# Patient Record
Sex: Female | Born: 1990 | Race: Black or African American | Hispanic: No | Marital: Single
Health system: Southern US, Community
[De-identification: ages and names within clinical notes are randomized; demographics above are authoritative.]

---

## 2019-07-01 ENCOUNTER — Ambulatory Visit: Payer: 59 | Attending: Internal Medicine

## 2019-07-01 DIAGNOSIS — Z23 Encounter for immunization: Secondary | ICD-10-CM

## 2019-07-01 NOTE — Progress Notes (Signed)
   Covid-19 Vaccination Clinic  Name:  SONNA LIPSKY    MRN: [id number] DOB: 1990-04-18  07/01/2019  Ms. Mishkin was observed post Covid-19 immunization for 15 minutes without incident. She was provided with Vaccine Information Sheet and instruction to access the V-Safe system.   Ms. Lapre was instructed to call 911 with any severe reactions post vaccine: Marland Kitchen Difficulty breathing  . Swelling of face and throat  . A fast heartbeat  . A bad rash all over body  . Dizziness and weakness   Immunizations Administered    Name Date Dose VIS Date Route   Pfizer COVID-19 Vaccine 07/01/2019  8:36 AM 0.3 mL 03/11/2019 Intramuscular   Manufacturer: [employer], [employer]   Lot: [id number]   NDC: [address]-6

## 2019-07-15 ENCOUNTER — Emergency Department: Payer: 59

## 2019-07-15 ENCOUNTER — Other Ambulatory Visit: Payer: Self-pay

## 2019-07-15 ENCOUNTER — Emergency Department
Admission: EM | Admit: 2019-07-15 | Discharge: 2019-07-15 | Disposition: A | Discharge status: Home / Self Care | Payer: 59 | Attending: Emergency Medicine | Admitting: Emergency Medicine

## 2019-07-15 ENCOUNTER — Encounter: Payer: Self-pay | Admitting: Emergency Medicine

## 2019-07-15 DIAGNOSIS — R0789 Other chest pain: Secondary | ICD-10-CM

## 2019-07-15 DIAGNOSIS — R079 Chest pain, unspecified: Secondary | ICD-10-CM | POA: Diagnosis not present

## 2019-07-15 DIAGNOSIS — R42 Dizziness and giddiness: Secondary | ICD-10-CM | POA: Diagnosis not present

## 2019-07-15 LAB — CBC
HCT: 39.1 % (ref 36.0–46.0)
Hemoglobin: 12.7 g/dL (ref 12.0–15.0)
MCH: 28.4 pg (ref 26.0–34.0)
MCHC: 32.5 g/dL (ref 30.0–36.0)
MCV: 87.5 fL (ref 80.0–100.0)
Platelets: 245 10*3/uL (ref 150–400)
RBC: 4.47 MIL/uL (ref 3.87–5.11)
RDW: 11.9 % (ref 11.5–15.5)
WBC: 4.9 10*3/uL (ref 4.0–10.5)
nRBC: 0 % (ref 0.0–0.2)

## 2019-07-15 LAB — BASIC METABOLIC PANEL
Anion gap: 10 (ref 5–15)
BUN: 11 mg/dL (ref 6–20)
CO2: 25 mmol/L (ref 22–32)
Calcium: 9.9 mg/dL (ref 8.9–10.3)
Chloride: 103 mmol/L (ref 98–111)
Creatinine, Ser: 0.72 mg/dL (ref 0.44–1.00)
GFR calc Af Amer: >60 mL/min (ref >60)
GFR calc non Af Amer: >60 mL/min (ref >60)
Glucose, Bld: 82 mg/dL (ref 70–99)
Potassium: 3.8 mmol/L (ref 3.5–5.1)
Sodium: 138 mmol/L (ref 135–145)

## 2019-07-15 LAB — TROPONIN I (HIGH SENSITIVITY): Troponin I (High Sensitivity): <2 ng/L (ref <18)

## 2019-07-15 MED ORDER — NAPROXEN 500 MG PO TABS: 500.0000 mg | ORAL_TABLET | Freq: Two times a day (BID) | ORAL | 2 refills | Status: AC

## 2019-07-15 NOTE — ED Provider Notes (Signed)
[facility] Emergency Department Provider Note   ____________________________________________    I have reviewed the triage vital signs and the nursing notes.   HISTORY  Chief Complaint Chest Pain and Dizziness     HPI Nichole Espinoza is a 29 y.o. female who presents with complaints of left anterior chest pain.  Patient reports she has had intermittent "twinges "in her left anterior chest close to her axilla for over a week now.  She denies shortness of breath.  No pleurisy.  Currently feels well and has no complaints.  No fevers chills or cough.  No history of heart disease.  No recent viral illnesses.  No nausea or vomiting or diaphoresis.  No injury to the area.  No calf pain or swelling  History reviewed. No pertinent past medical history.  There are no problems to display for this patient.   History reviewed. No pertinent surgical history.  Prior to Admission medications   Medication Sig Start Date End Date Taking? Authorizing Provider  naproxen (NAPROSYN) 500 MG tablet Take 1 tablet (500 mg total) by mouth 2 (two) times daily with a meal. 07/15/19   Jene Every, MD     Allergies Patient has no allergy information on record.  No family history on file.  Social History Social History   Tobacco Use  . Smoking status: Not on file  Substance Use Topics  . Alcohol use: Not on file  . Drug use: Not on file    Review of Systems  Constitutional: No fever/chills Eyes: No visual changes.  ENT: No sore throat. Cardiovascular: As above Respiratory: Denies shortness of breath. Gastrointestinal: No abdominal pain.   Genitourinary: Negative for dysuria. Musculoskeletal: Negative for back pain. Skin: Negative for rash. Neurological: Negative for headaches   ____________________________________________   PHYSICAL EXAM:  VITAL SIGNS: ED Triage Vitals  Enc Vitals Group     BP 07/15/19 1058 116/79     Pulse Rate 07/15/19 1058 84    Resp 07/15/19 1058 20     Temp 07/15/19 1100 99 F (37.2 C)     Temp Source 07/15/19 1100 Oral     SpO2 07/15/19 1058 99 %     Weight 07/15/19 1058 48.5 kg (107 lb)     Height 07/15/19 1058 1.651 m (5\' 5" )     Head Circumference --      Peak Flow --      Pain Score 07/15/19 1058 10     Pain Loc --      Pain Edu? --      Excl. in GC? --     Constitutional: Alert and oriented.   Nose: No congestion/rhinnorhea. Mouth/Throat: Mucous membranes are moist.   Neck:  Painless ROM Cardiovascular: Normal rate, regular rhythm. Grossly normal heart sounds.  Good peripheral circulation.  Palpated chest wall, no abnormalities bruising or rash Respiratory: Normal respiratory effort.  No retractions. Lungs CTAB. Gastrointestinal: Soft and nontender. No distention.  No CVA tenderness.  Musculoskeletal: No lower extremity tenderness nor edema.  Warm and well perfused Neurologic:  Normal speech and language. No gross focal neurologic deficits are appreciated.  Skin:  Skin is warm, dry and intact. No rash noted. Psychiatric: Mood and affect are normal. Speech and behavior are normal.  ____________________________________________   LABS (all labs ordered are listed, but only abnormal results are displayed)  Labs Reviewed  BASIC METABOLIC PANEL  CBC  POC URINE PREG, ED  TROPONIN I (HIGH SENSITIVITY)   ____________________________________________  EKG  ED ECG REPORT I, Lavonia Drafts, the attending physician, personally viewed and interpreted this ECG.  Date: 07/15/2019  Rhythm: normal sinus rhythm QRS Axis: normal Intervals: normal ST/T Wave abnormalities: normal Narrative Interpretation: no evidence of acute ischemia  ____________________________________________  RADIOLOGY  Chest x-ray unremarkable ____________________________________________   PROCEDURES  Procedure(s) performed: No  Procedures   Critical Care performed:  No ____________________________________________   INITIAL IMPRESSION / ASSESSMENT AND PLAN / ED COURSE  Pertinent labs & imaging results that were available during my care of the patient were reviewed by me and considered in my medical decision making (see chart for details).  Patient very well-appearing in no acute distress, description is suspicious for musculoskeletal chest pain.  Differential also includes myocarditis, pericarditis, very low risk for ACS.  Low risk for PE.  PERC negative.  EKG is quite reassuring.  Troponin is undetectable.  Lab work is benign.  Chest x-ray rules out pneumonia or pneumothorax.  Patient is asymptomatic here.  Recommend discharge with NSAIDs, outpatient follow-up with cardiology if any return of symptoms.  Return precautions discussed    ____________________________________________   FINAL CLINICAL IMPRESSION(S) / ED DIAGNOSES  Final diagnoses:  Atypical chest pain        Note:  This document was prepared using Dragon voice recognition software and may include unintentional dictation errors.   Lavonia Drafts, MD 07/15/19 [phone]

## 2019-07-15 NOTE — ED Triage Notes (Signed)
Pt reports was at work and all the sudden she got real hot and had to get up and walk around. Pt reports then she started with a sharp pain in her chest and some tingling in her left arm.

## 2019-07-25 ENCOUNTER — Ambulatory Visit: Payer: 59

## 2021-12-10 IMAGING — CR DG CHEST 2V
1 series · 2 of 2 positions shown · non-contrast
Comparison: None.

CLINICAL DATA: Chest pain

EXAM:
CHEST - 2 VIEW

[Series 1: dg chest 2 view · 0.14mm/px · 2 of 2 slices shown]
[im 1/2]
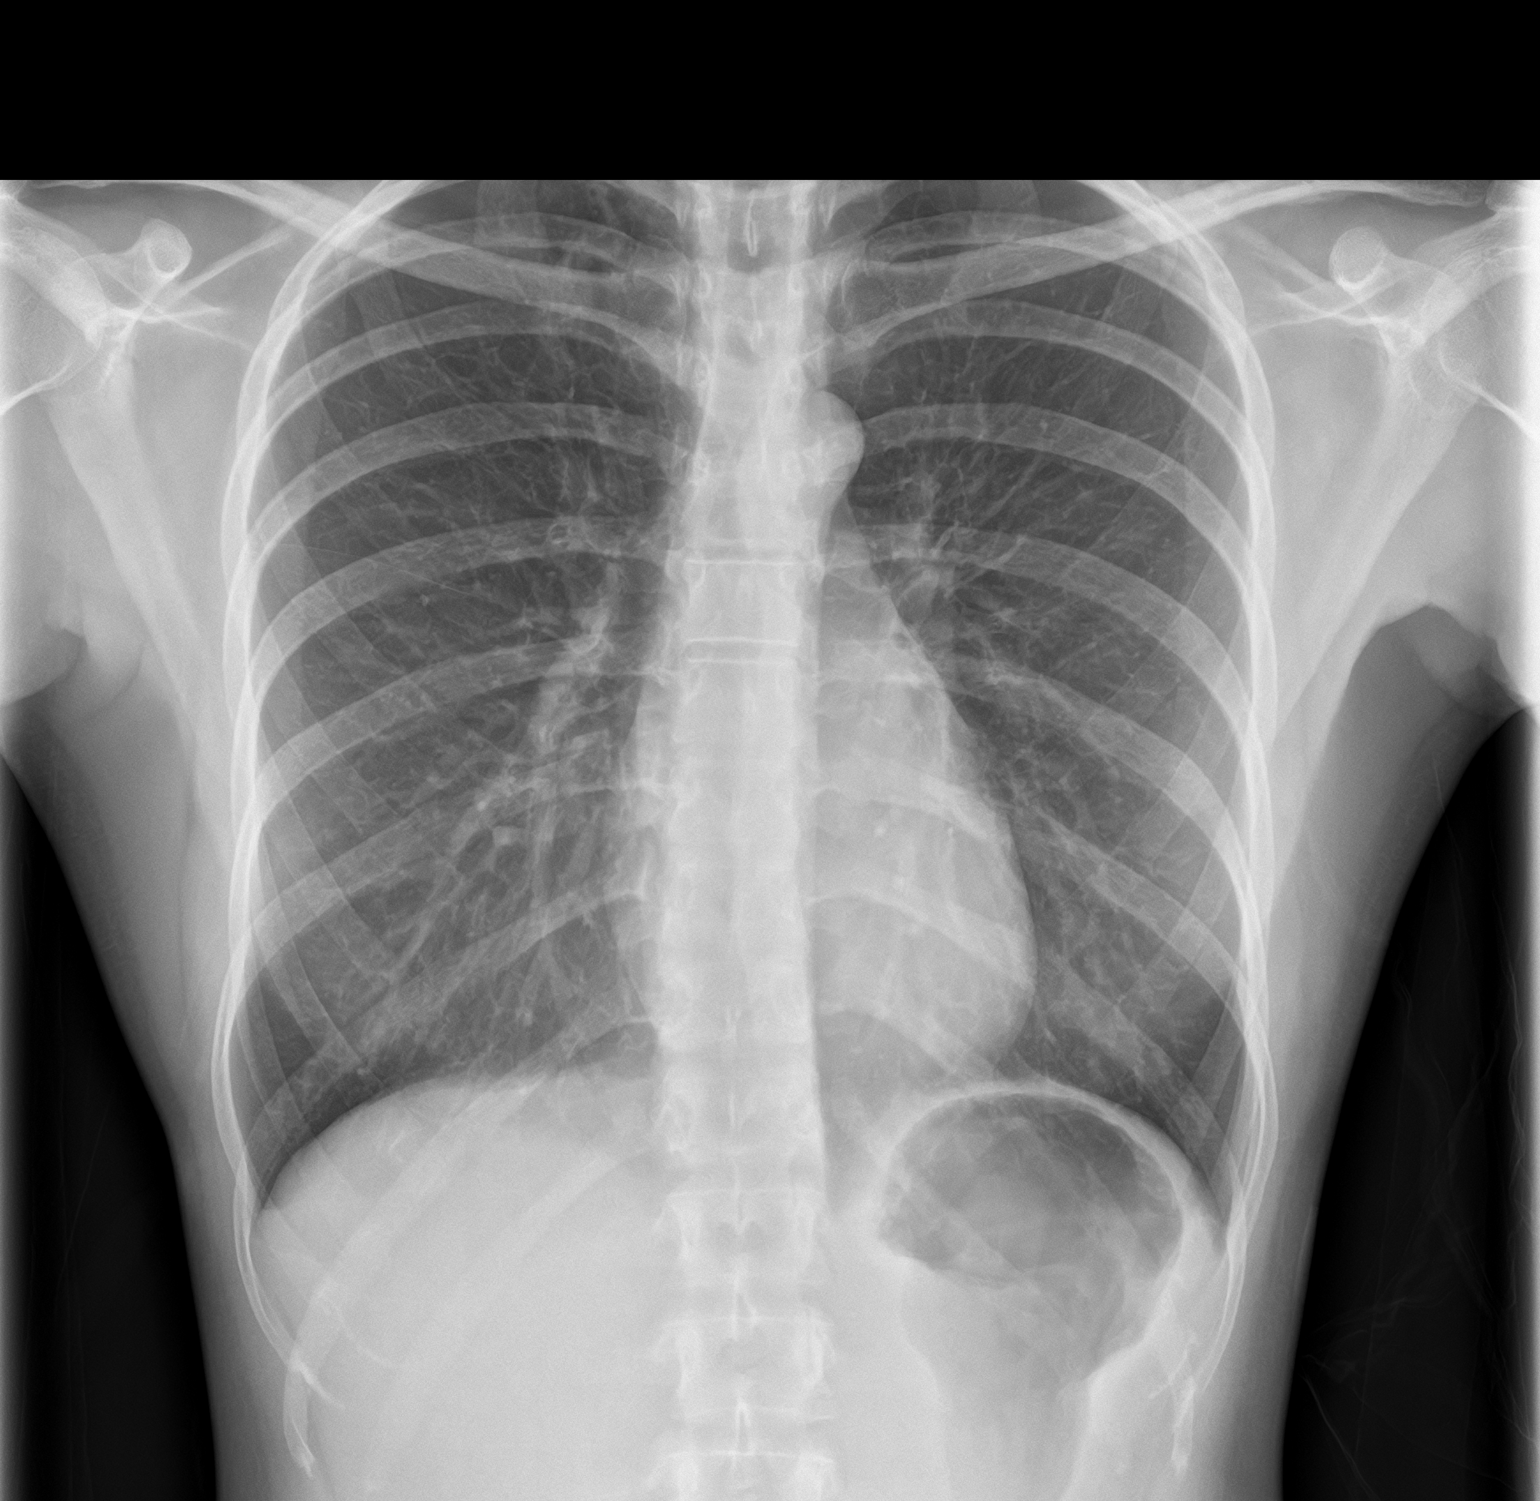
[im 2/2]
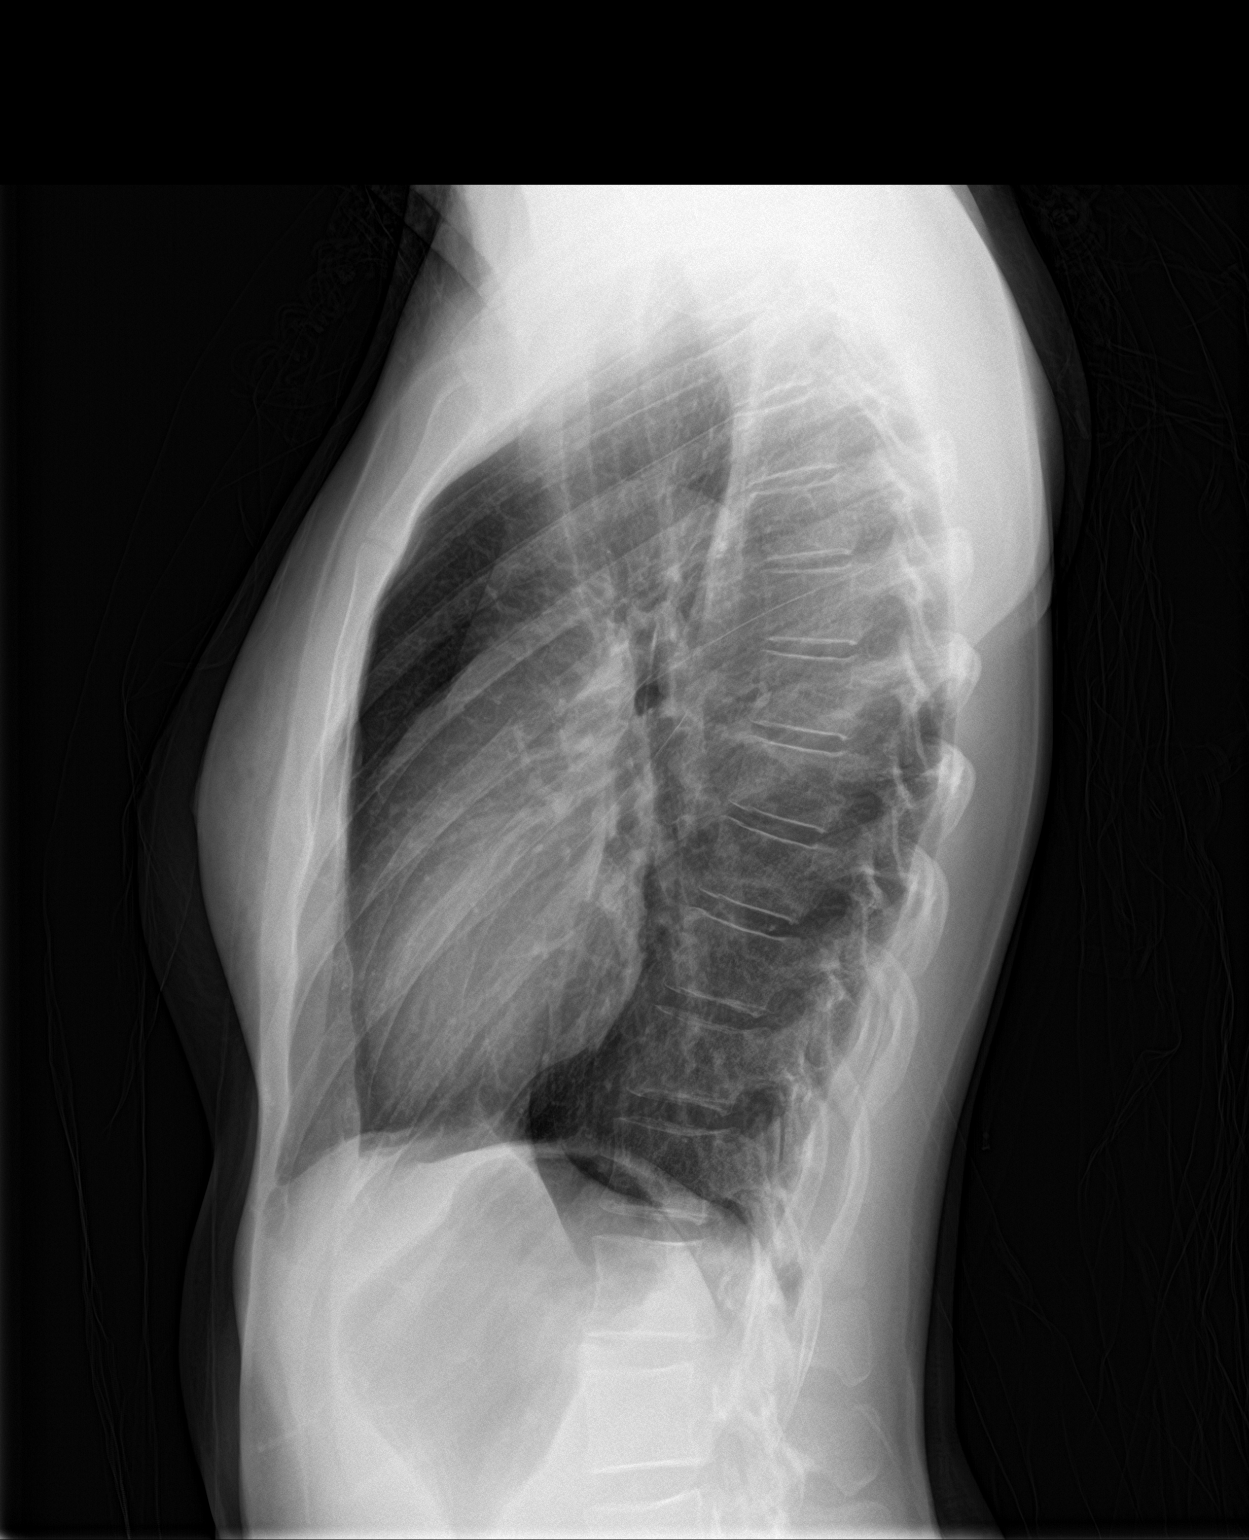

[2 of 2 positions shown; findings below may reference images not displayed]

FINDINGS: The heart size and mediastinal contours are within normal limits.
Both lungs are clear. The visualized skeletal structures are
unremarkable.
IMPRESSION: No active cardiopulmonary disease.
